# Patient Record
Sex: Male | Born: 1975 | Race: Black or African American | Hispanic: No | Marital: Single | Smoking: Former smoker
Health system: Southern US, Community
[De-identification: ages and names within clinical notes are randomized; demographics above are authoritative.]

## PROBLEM LIST (undated history)

## (undated) DIAGNOSIS — J302 Other seasonal allergic rhinitis: Secondary | ICD-10-CM

## (undated) DIAGNOSIS — E785 Hyperlipidemia, unspecified: Secondary | ICD-10-CM

## (undated) DIAGNOSIS — A549 Gonococcal infection, unspecified: Secondary | ICD-10-CM

## (undated) DIAGNOSIS — Z973 Presence of spectacles and contact lenses: Secondary | ICD-10-CM

## (undated) DIAGNOSIS — G8194 Hemiplegia, unspecified affecting left nondominant side: Secondary | ICD-10-CM

## (undated) DIAGNOSIS — G510 Bell's palsy: Secondary | ICD-10-CM

## (undated) HISTORY — PX: WISDOM TOOTH EXTRACTION: SHX21

## (undated) HISTORY — PX: APPENDECTOMY: SHX54

## (undated) HISTORY — DX: Other seasonal allergic rhinitis: J30.2

## (undated) HISTORY — DX: Bell's palsy: G51.0

## (undated) HISTORY — DX: Hyperlipidemia, unspecified: E78.5

## (undated) HISTORY — DX: Presence of spectacles and contact lenses: Z97.3

## (undated) HISTORY — DX: Gonococcal infection, unspecified: A54.9

## (undated) HISTORY — DX: Hemiplegia, unspecified affecting left nondominant side: G81.94

---

## 2011-05-04 ENCOUNTER — Emergency Department (INDEPENDENT_AMBULATORY_CARE_PROVIDER_SITE_OTHER)
Admission: EM | Admit: 2011-05-04 | Discharge: 2011-05-04 | Disposition: A | Discharge status: Home / Self Care | Payer: 59 | Source: Home / Self Care | Attending: Emergency Medicine | Admitting: Emergency Medicine

## 2011-05-04 DIAGNOSIS — S60944A Unspecified superficial injury of right ring finger, initial encounter: Secondary | ICD-10-CM

## 2011-05-04 DIAGNOSIS — S60949A Unspecified superficial injury of unspecified finger, initial encounter: Secondary | ICD-10-CM

## 2011-05-04 MED ORDER — TETANUS-DIPHTH-ACELL PERTUSSIS 5-2.5-18.5 LF-MCG/0.5 IM SUSP
0.5000 mL | Freq: Once | INTRAMUSCULAR | Status: AC
  Administered 2011-05-04: 0.5 mL via INTRAMUSCULAR

## 2011-05-04 MED ORDER — BACITRACIN 500 UNIT/GM EX OINT
1.0000 "application " | TOPICAL_OINTMENT | Freq: Once | CUTANEOUS | Status: AC
  Administered 2011-05-04: 1 via TOPICAL

## 2011-05-04 MED ORDER — TETANUS-DIPHTH-ACELL PERTUSSIS 5-2.5-18.5 LF-MCG/0.5 IM SUSP
INTRAMUSCULAR | Status: AC
  Filled 2011-05-04: qty 0.5

## 2011-05-04 NOTE — ED Notes (Signed)
Last tetanus was greater than 10 years per pt

## 2011-05-04 NOTE — ED Notes (Signed)
Avulsion injury to tip of rt 4th finger- no active bleeding.

## 2011-05-04 NOTE — ED Provider Notes (Signed)
History     CSN: [id number] Arrival date & time: 05/04/2011  8:39 AM   First MD Initiated Contact with Patient 05/04/11 0825      Chief Complaint  Patient presents with  . Finger Injury    (Consider location/radiation/quality/duration/timing/severity/associated sxs/prior treatment) HPI Comments: righthanded male sliced distal tip of right ring finger off yesterday with a mandolin at home. No N/V, parasthesias, redness, swelling, limitation of motion, weakness. Has not tried anything for this.  Last tetanus 1994.   Patient is a 35 y.o. male presenting with hand pain.  Hand Pain The current episode started yesterday. The problem has not changed since onset.The symptoms are aggravated by nothing. He has tried nothing for the symptoms.    History reviewed. No pertinent past medical history.  History reviewed. No pertinent past surgical history.  History reviewed. No pertinent family history.  History  Substance Use Topics  . Smoking status: Never Smoker   . Smokeless tobacco: Not on file  . Alcohol Use: Yes      Review of Systems  Constitutional: Negative for fever.  Gastrointestinal: Negative for nausea and vomiting.  Musculoskeletal: Negative for joint swelling.  Skin: Positive for wound.  Neurological: Negative for weakness and numbness.    Allergies  Review of patient's allergies indicates no known allergies.  Home Medications  No current outpatient prescriptions on file.  BP 133/62  Pulse 60  Temp(Src) 98.6 F (37 C) (Oral)  Resp 16  SpO2 100%  Physical Exam  Nursing note and vitals reviewed. Constitutional: He is oriented to person, place, and time. He appears well-developed and well-nourished.  HENT:  Head: Normocephalic and atraumatic.  Eyes: Conjunctivae and EOM are normal. Pupils are equal, round, and reactive to light.  Neck: Normal range of motion.  Cardiovascular: Regular rhythm.   Pulmonary/Chest: Effort normal. No respiratory distress.    Abdominal: He exhibits no distension.  Musculoskeletal: Normal range of motion.       Missing small amt soft tissue at distal ring finger. Clean cut.  No bleeding. No FB seen. Baseline Strength and Sensation to R ring finger hand with normal light touch intact for Pt, distal motor and sensation in median/radial/ulnar nerve distribution with CR< 2 secs and pulse intact.  Ring finger with intact motor strength 5/5 flexion / extension /FDP / FDS  against resistance and 2-point discrimination intact at 5mm.. Wrist WNLs   Neurological: He is alert and oriented to person, place, and time.  Skin: Skin is warm and dry.  Psychiatric: He has a normal mood and affect. His behavior is normal.    ED Course  Procedures (including critical care time)  Labs Reviewed - No data to display No results found.   1. Superficial injury of right ring finger       MDM  soaked wound in chlorhexadine, scrubbed with wter/chlorhexadine, bacitracin, sterile dressing. 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 \. Pt declined pain medication at this time.     Luiz Blare, MD 05/04/11 [phone]

## 2011-05-04 NOTE — ED Notes (Signed)
Pt states he was slicing food yesterday around 5 pm and cut his rt 4th finger tip.

## 2011-10-19 ENCOUNTER — Ambulatory Visit
Admission: RE | Admit: 2011-10-19 | Discharge: 2011-10-19 | Disposition: A | Discharge status: Home / Self Care | Payer: 59 | Source: Ambulatory Visit | Attending: Family Medicine | Admitting: Family Medicine

## 2011-10-19 ENCOUNTER — Other Ambulatory Visit: Payer: Self-pay | Admitting: Family Medicine

## 2011-10-19 DIAGNOSIS — M25519 Pain in unspecified shoulder: Secondary | ICD-10-CM

## 2011-10-19 DIAGNOSIS — Z0000 Encounter for general adult medical examination without abnormal findings: Secondary | ICD-10-CM

## 2012-08-27 ENCOUNTER — Encounter: Payer: Self-pay | Admitting: Medical

## 2012-08-27 ENCOUNTER — Ambulatory Visit (INDEPENDENT_AMBULATORY_CARE_PROVIDER_SITE_OTHER): Payer: 59 | Admitting: Medical

## 2012-08-27 VITALS — BP 138/80 | HR 68 | Temp 98.1°F | Resp 16 | Ht 69.0 in | Wt 215.0 lb

## 2012-08-27 DIAGNOSIS — E669 Obesity, unspecified: Secondary | ICD-10-CM

## 2012-08-27 DIAGNOSIS — E785 Hyperlipidemia, unspecified: Secondary | ICD-10-CM

## 2012-08-27 NOTE — Patient Instructions (Signed)
Cut down on high cholesterol foods, start exercising daily, recheck fasting in 3 months.  Hypercholesterolemia High Blood Cholesterol Cholesterol is a white, waxy, fat-like protein needed by your body in small amounts. The liver makes all the cholesterol you need. It is carried from the liver by the blood through the blood vessels. Deposits (plaque) may build up on blood vessel walls. This makes the arteries narrower and stiffer. Plaque increases the risk for heart attack and stroke. You cannot feel your cholesterol level even if it is very high. The only way to know is by a blood test to check your lipid (fats) levels. Once you know your cholesterol levels, you should keep a record of the test results. Work with your caregiver to to keep your levels in the desired range. WHAT THE RESULTS MEAN:  Total cholesterol is a rough measure of all the cholesterol in your blood.  LDL is the so-called bad cholesterol. This is the type that deposits cholesterol in the walls of the arteries. You want this level to be low.  HDL is the good cholesterol because it cleans the arteries and carries the LDL away. You want this level to be high.  Triglycerides are fat that the body can either burn for energy or store. High levels are closely linked to heart disease. DESIRED LEVELS:  Total cholesterol below 200.  LDL below 100 for people at risk, below 70 for very high risk.  HDL above 50 is good, above 60 is best.  Triglycerides below 150. HOW TO LOWER YOUR CHOLESTEROL:  Diet.  Choose fish or white meat chicken and [place], roasted or baked. Limit fatty cuts of red meat, fried foods, and processed meats, such as sausage and lunch meat.  Eat lots of fresh fruits and vegetables. Choose whole grains, beans, pasta, potatoes and cereals.  Use only small amounts of olive, corn or canola oils. Avoid butter, mayonnaise, shortening or palm kernel oils. Avoid foods with trans-fats.  Use skim/nonfat milk and  low-fat/nonfat yogurt and cheeses. Avoid whole milk, cream, ice cream, egg yolks and cheeses. Healthy desserts include angel food cake, gingersnaps, animal crackers, hard candy, popsicles, and low-fat/nonfat frozen yogurt. Avoid pastries, cakes, pies and cookies.  Exercise.  A regular program helps decrease LDL and raises HDL.  Helps with weight control.  Do things that increase your activity level like gardening, walking, or taking the stairs.  Medication.  May be prescribed by your caregiver to help lowering cholesterol and the risk for heart disease.  You may need medicine even if your levels are normal if you have several risk factors. HOME CARE INSTRUCTIONS   Follow your diet and exercise programs as suggested by your caregiver.  Take medications as directed.  Have blood work done when your caregiver feels it is necessary. MAKE SURE YOU:   Understand these instructions.  Will watch your condition.  Will get help right away if you are not doing well or get worse. Document Released: 05/08/2005 Document Revised: 07/31/2011 Document Reviewed: 10/24/2006 [facility] Patient Information 2013 [place], [place].

## 2012-08-27 NOTE — Progress Notes (Signed)
Subjective: Here as a new patient today.   Wants to establish care.   Has a hx/o high cholesterol x a few years, but didn't agree to starting medication this past year.   He thinks high cholesterol runs in his family.  Not much exercise at current.  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   He eats a lot of sweets, but no so much red meat or other high cholesterol foods.  Nonsmoker.  He last took Pravachol a few weeks ago.  Prior to a few weeks ago he would take medication a few days then forget a few days.    He is not sure of prior cholesterol numbers.  He is nonfasting.   No other c/o.    Objective: Filed Vitals:   08/27/12 1529  BP: 138/80  Pulse: 68  Temp: 98.1 F (36.7 C)  Resp: 16    General appearance: alert, no distress, WD/WN  Neck: supple, no lymphadenopathy, no thyromegaly, no masses, no bruits Heart: RRR, normal S1, S2, no murmurs Lungs: CTA bilaterally, no wheezes, rhonchi, or rales Pulses: 2+ symmetric, upper and lower extremities, normal cap refill  Assessment: Encounter Diagnoses  Name Primary?  . Hyperlipidemia Yes  . Obesity, unspecified     Plan: Make lifestyle changes, start exercising, avoid high cholesterol foods as we discussed, recheck fasting 47mo.   We will request prior records.  Follow-up 47mo

## 2012-11-13 ENCOUNTER — Encounter: Payer: Self-pay | Admitting: Internal Medicine

## 2012-11-26 ENCOUNTER — Ambulatory Visit: Payer: 59 | Admitting: Medical

## 2013-07-23 ENCOUNTER — Encounter: Payer: 59 | Admitting: Medical

## 2013-07-30 ENCOUNTER — Ambulatory Visit (INDEPENDENT_AMBULATORY_CARE_PROVIDER_SITE_OTHER): Payer: 59 | Admitting: Medical

## 2013-07-30 ENCOUNTER — Encounter: Payer: Self-pay | Admitting: Medical

## 2013-07-30 VITALS — BP 138/88 | HR 60 | Temp 98.0°F | Resp 14 | Ht 69.0 in | Wt 208.0 lb

## 2013-07-30 DIAGNOSIS — Z113 Encounter for screening for infections with a predominantly sexual mode of transmission: Secondary | ICD-10-CM

## 2013-07-30 DIAGNOSIS — H579 Unspecified disorder of eye and adnexa: Secondary | ICD-10-CM

## 2013-07-30 DIAGNOSIS — E785 Hyperlipidemia, unspecified: Secondary | ICD-10-CM

## 2013-07-30 DIAGNOSIS — Z0000 Encounter for general adult medical examination without abnormal findings: Secondary | ICD-10-CM

## 2013-07-30 DIAGNOSIS — R29810 Facial weakness: Secondary | ICD-10-CM

## 2013-07-30 DIAGNOSIS — E669 Obesity, unspecified: Secondary | ICD-10-CM

## 2013-07-30 LAB — COMPREHENSIVE METABOLIC PANEL
ALBUMIN: 4.8 g/dL (ref 3.5–5.2)
ALK PHOS: 68 U/L (ref 39–117)
ALT: 21 U/L (ref 0–53)
AST: 21 U/L (ref 0–37)
BUN: 11 mg/dL (ref 6–23)
CALCIUM: 9.8 mg/dL (ref 8.4–10.5)
CHLORIDE: 101 meq/L (ref 96–112)
CO2: 30 mEq/L (ref 19–32)
Creat: 0.93 mg/dL (ref 0.50–1.35)
Glucose, Bld: 88 mg/dL (ref 70–99)
POTASSIUM: 4.2 meq/L (ref 3.5–5.3)
SODIUM: 139 meq/L (ref 135–145)
TOTAL PROTEIN: 7.8 g/dL (ref 6.0–8.3)
Total Bilirubin: 1 mg/dL (ref 0.2–1.2)

## 2013-07-30 LAB — POCT URINALYSIS DIPSTICK
BILIRUBIN UA: NEGATIVE
Blood, UA: NEGATIVE
GLUCOSE UA: NEGATIVE
Ketones, UA: NEGATIVE
LEUKOCYTES UA: NEGATIVE
NITRITE UA: NEGATIVE
Protein, UA: NEGATIVE
Spec Grav, UA: <=1.005
UROBILINOGEN UA: NEGATIVE
pH, UA: 7

## 2013-07-30 LAB — CBC
HEMATOCRIT: 43.5 % (ref 39.0–52.0)
HEMOGLOBIN: 15.2 g/dL (ref 13.0–17.0)
MCH: 30.8 pg (ref 26.0–34.0)
MCHC: 34.9 g/dL (ref 30.0–36.0)
MCV: 88.1 fL (ref 78.0–100.0)
Platelets: 281 10*3/uL (ref 150–400)
RBC: 4.94 MIL/uL (ref 4.22–5.81)
RDW: 12.9 % (ref 11.5–15.5)
WBC: 8.2 10*3/uL (ref 4.0–10.5)

## 2013-07-30 LAB — LIPID PANEL
Cholesterol: 211 mg/dL — ABNORMAL HIGH (ref 0–200)
HDL: 38 mg/dL — AB (ref >39)
LDL CALC: 137 mg/dL — AB (ref 0–99)
Total CHOL/HDL Ratio: 5.6 Ratio
Triglycerides: 178 mg/dL — ABNORMAL HIGH (ref <150)
VLDL: 36 mg/dL (ref 0–40)

## 2013-07-30 NOTE — Patient Instructions (Addendum)
Thank you for giving me the opportunity to serve you today.    Your diagnosis today includes: Encounter Diagnoses  Name Primary?  . Routine general medical examination at a health care facility Yes  . Abnormal vision screen   . Hyperlipidemia   . Screen for STD (sexually transmitted disease)   . Facial weakness   . Obesity, unspecified      Specific recommendations today include:  See eye doctor due to abnormal vision exam  See dentist regularly  Exercises regularly, most days of the week  Eat a healthy diet  Obesity-work on losing weight through healthy diet and exercise  Facial weakness-we will call back about recommendations for this  Follow up: pending labs   I have included other useful information below for your review.  Hypertriglyceridemia  Diet for High blood levels of Triglycerides Most fats in food are triglycerides. Triglycerides in your blood are stored as fat in your body. High levels of triglycerides in your blood may put you at a greater risk for heart disease and stroke.  Normal triglyceride levels are less than 150 mg/dL. Borderline high levels are 150-199 mg/dl. High levels are 200 - 499 mg/dL, and very high triglyceride levels are greater than 500 mg/dL. The decision to treat high triglycerides is generally based on the level. For people with borderline or high triglyceride levels, treatment includes weight loss and exercise. Drugs are recommended for people with very high triglyceride levels. Many people who need treatment for high triglyceride levels have metabolic syndrome. This syndrome is a collection of disorders that often include: insulin resistance, high blood pressure, blood clotting problems, high cholesterol and triglycerides. TESTING PROCEDURE FOR TRIGLYCERIDES  You should not eat 4 hours before getting your triglycerides measured. The normal range of triglycerides is between 10 and 250 milligrams per deciliter (mg/dl). Some people may have  extreme levels (1000 or above), but your triglyceride level may be too high if it is above 150 mg/dl, depending on what other risk factors you have for heart disease.  People with high blood triglycerides may also have high blood cholesterol levels. If you have high blood cholesterol as well as high blood triglycerides, your risk for heart disease is probably greater than if you only had high triglycerides. High blood cholesterol is one of the main risk factors for heart disease. CHANGING YOUR DIET  Your weight can affect your blood triglyceride level. If you are more than 20% above your ideal body weight, you may be able to lower your blood triglycerides by losing weight. Eating less and exercising regularly is the best way to combat this. Fat provides more calories than any other food. The best way to lose weight is to eat less fat. Only 30% of your total calories should come from fat. Less than 7% of your diet should come from saturated fat. A diet low in fat and saturated fat is the same as a diet to decrease blood cholesterol. By eating a diet lower in fat, you may lose weight, lower your blood cholesterol, and lower your blood triglyceride level.  Eating a diet low in fat, especially saturated fat, may also help you lower your blood triglyceride level. Ask your dietitian to help you figure how much fat you can eat based on the number of calories your caregiver has prescribed for you.  Exercise, in addition to helping with weight loss may also help lower triglyceride levels.   Alcohol can increase blood triglycerides. You may need to stop drinking alcoholic  beverages.  Too much carbohydrate in your diet may also increase your blood triglycerides. Some complex carbohydrates are necessary in your diet. These may include bread, rice, potatoes, other starchy vegetables and cereals.  Reduce "simple" carbohydrates. These may include pure sugars, candy, honey, and jelly without losing other nutrients. If  you have the kind of high blood triglycerides that is affected by the amount of carbohydrates in your diet, you will need to eat less sugar and less high-sugar foods. Your caregiver can help you with this.  Adding 2-4 grams of fish oil (EPA+ DHA) may also help lower triglycerides. Speak with your caregiver before adding any supplements to your regimen. Following the Diet  Maintain your ideal weight. Your caregivers can help you with a diet. Generally, eating less food and getting more exercise will help you lose weight. Joining a weight control group may also help. Ask your caregivers for a good weight control group in your area.  Eat low-fat foods instead of high-fat foods. This can help you lose weight too.  These foods are lower in fat. Eat MORE of these:   Dried beans, peas, and lentils.  Egg whites.  Low-fat cottage cheese.  Fish.  Lean cuts of meat, such as round, sirloin, rump, and flank (cut extra fat off meat you fix).  Whole grain breads, cereals and pasta.  Skim and nonfat dry milk.  Low-fat yogurt.  Poultry without the skin.  Cheese made with skim or part-skim milk, such as mozzarella, parmesan, farmers', ricotta, or pot cheese. These are higher fat foods. Eat LESS of these:   Whole milk and foods made from whole milk, such as American, blue, cheddar, monterey jack, and swiss cheese  High-fat meats, such as luncheon meats, sausages, knockwurst, bratwurst, hot dogs, ribs, corned beef, ground pork, and regular ground beef.  Fried foods. Limit saturated fats in your diet. Substituting unsaturated fat for saturated fat may decrease your blood triglyceride level. You will need to read package labels to know which products contain saturated fats.  These foods are high in saturated fat. Eat LESS of these:   Fried pork skins.  Whole milk.  Skin and fat from poultry.  Palm oil.  Butter.  Shortening.  Cream cheese.  Tomasa Blase.  Margarines and baked goods made from  listed oils.  Vegetable shortenings.  Chitterlings.  Fat from meats.  Coconut oil.  Palm kernel oil.  Lard.  Cream.  Sour cream.  Fatback.  Coffee whiteners and non-dairy creamers made with these oils.  Cheese made from whole milk. Use unsaturated fats (both polyunsaturated and monounsaturated) moderately. Remember, even though unsaturated fats are better than saturated fats; you still want a diet low in total fat.  These foods are high in unsaturated fat:   Canola oil.  Sunflower oil.  Mayonnaise.  Almonds.  Peanuts.  Pine nuts.  Margarines made with these oils.  Safflower oil.  Olive oil.  Avocados.  Cashews.  Peanut butter.  Sunflower seeds.  Soybean oil.  Peanut oil.  Olives.  Pecans.  Walnuts.  Pumpkin seeds. Avoid sugar and other high-sugar foods. This will decrease carbohydrates without decreasing other nutrients. Sugar in your food goes rapidly to your blood. When there is excess sugar in your blood, your liver may use it to make more triglycerides. Sugar also contains calories without other important nutrients.  Eat LESS of these:   Sugar, brown sugar, powdered sugar, jam, jelly, preserves, honey, syrup, molasses, pies, candy, cakes, cookies, frosting, pastries, colas, soft drinks, punches,  fruit drinks, and regular gelatin.  Avoid alcohol. Alcohol, even more than sugar, may increase blood triglycerides. In addition, alcohol is high in calories and low in nutrients. Ask for sparkling water, or a diet soft drink instead of an alcoholic beverage. Suggestions for planning and preparing meals   Bake, broil, grill or roast meats instead of frying.  Remove fat from meats and skin from poultry before cooking.  Add spices, herbs, lemon juice or vinegar to vegetables instead of salt, rich sauces or gravies.  Use a non-stick skillet without fat or use no-stick sprays.  Cool and refrigerate stews and broth. Then remove the hardened fat  floating on the surface before serving.  Refrigerate meat drippings and skim off fat to make low-fat gravies.  Serve more fish.  Use less butter, margarine and other high-fat spreads on bread or vegetables.  Use skim or reconstituted non-fat dry milk for cooking.  Cook with low-fat cheeses.  Substitute low-fat yogurt or cottage cheese for all or part of the sour cream in recipes for sauces, dips or congealed salads.  Use half yogurt/half mayonnaise in salad recipes.  Substitute evaporated skim milk for cream. Evaporated skim milk or reconstituted non-fat dry milk can be whipped and substituted for whipped cream in certain recipes.  Choose fresh fruits for dessert instead of high-fat foods such as pies or cakes. Fruits are naturally low in fat. When Dining Out   Order low-fat appetizers such as fruit or vegetable juice, pasta with vegetables or tomato sauce.  Select clear, rather than cream soups.  Ask that dressings and gravies be served on the side. Then use less of them.  Order foods that are baked, broiled, poached, steamed, stir-fried, or roasted.  Ask for margarine instead of butter, and use only a small amount.  Drink sparkling water, unsweetened tea or coffee, or diet soft drinks instead of alcohol or other sweet beverages. QUESTIONS AND ANSWERS ABOUT OTHER FATS IN THE BLOOD: SATURATED FAT, TRANS FAT, AND CHOLESTEROL What is trans fat? Trans fat is a type of fat that is formed when vegetable oil is hardened through a process called hydrogenation. This process helps makes foods more solid, gives them shape, and prolongs their shelf life. Trans fats are also called hydrogenated or partially hydrogenated oils.  What do saturated fat, trans fat, and cholesterol in foods have to do with heart disease? Saturated fat, trans fat, and cholesterol in the diet all raise the level of LDL "bad" cholesterol in the blood. The higher the LDL cholesterol, the greater the risk for coronary  heart disease (CHD). Saturated fat and trans fat raise LDL similarly.  What foods contain saturated fat, trans fat, and cholesterol? High amounts of saturated fat are found in animal products, such as fatty cuts of meat, chicken skin, and full-fat dairy products like butter, whole milk, cream, and cheese, and in tropical vegetable oils such as palm, palm kernel, and coconut oil. Trans fat is found in some of the same foods as saturated fat, such as vegetable shortening, some margarines (especially hard or stick margarine), crackers, cookies, baked goods, fried foods, salad dressings, and other processed foods made with partially hydrogenated vegetable oils. Small amounts of trans fat also occur naturally in some animal products, such as milk products, beef, and lamb. Foods high in cholesterol include liver, other organ meats, egg yolks, shrimp, and full-fat dairy products. How can I use the new food label to make heart-healthy food choices? Check the Nutrition Facts panel of the food  label. Choose foods lower in saturated fat, trans fat, and cholesterol. For saturated fat and cholesterol, you can also use the Percent Daily Value (%DV): 5% DV or less is low, and 20% DV or more is high. (There is no %DV for trans fat.) Use the Nutrition Facts panel to choose foods low in saturated fat and cholesterol, and if the trans fat is not listed, read the ingredients and limit products that list shortening or hydrogenated or partially hydrogenated vegetable oil, which tend to be high in trans fat. POINTS TO REMEMBER:   Discuss your risk for heart disease with your caregivers, and take steps to reduce risk factors.  Change your diet. Choose foods that are low in saturated fat, trans fat, and cholesterol.  Add exercise to your daily routine if it is not already being done. Participate in physical activity of moderate intensity, like brisk walking, for at least 30 minutes on most, and preferably all days of the week. No  time? Break the 30 minutes into three, 10-minute segments during the day.  Stop smoking. If you do smoke, contact your caregiver to discuss ways in which they can help you quit.  Do not use street drugs.  Maintain a normal weight.  Maintain a healthy blood pressure.  Keep up with your blood work for checking the fats in your blood as directed by your caregiver. Document Released: 02/24/2004 Document Revised: 11/07/2011 Document Reviewed: 09/21/2008 [facility] Patient Information 2014 [place], [place].   Ophthalmology Dr. Glenford Peers [address] [place], [place] [address] [phone]   [facility] Dr. Gelene Mink [address] [place] [place], [place] [address]  [phone] [email or web address]   Vincenza Hews, M.D. Susanne Greenhouse, O.D. [address] [place], [place] [address] Medical telephone: [phone] Optical telephone: [phone]

## 2013-07-30 NOTE — Progress Notes (Signed)
Subjective:   HPI  Jeremiah Orozco is a 38 y.o. male who presents for a complete physical.  Medical care team includes:  Kristian Covey, PA-C here for primary care  Dentist - Dr. Shawnie Dapper   Preventative care: Last ophthalmology visit:n/a Last dental visit:yes Dr. Shawnie Dapper Last colonoscopy:n/a Last prostate exam: n/a Last RUE:AVWUJ Last labs:?  Prior vaccinations: TD or Tdap:04/2011 Influenza:n/a Pneumococcal:n/a Shingles/Zostavax:n/a Other: ?  Advanced directive:n/a Health care power of attorney:n/a Living will:n/a  Concerns: Wants STD screening.  Hx/o left facial weakness x few years ago.  Never saw neurology.  Saw urgent care, they thought it could be bells palsy vs stroke.   Reviewed their medical, surgical, family, social, medication, and allergy history and updated chart as appropriate.  Past Medical History  Diagnosis Date  . Hyperlipidemia   . Seasonal allergic rhinitis   . Wears contact lenses   . Gonorrhea     history of    Past Surgical History  Procedure Laterality Date  . Appendectomy      age 83yo  . Wisdom tooth extraction      History   Social History  . Marital Status: Single    Spouse Name: N/A    Number of Children: N/A  . Years of Education: N/A   Occupational History  . Not on file.   Social History Main Topics  . Smoking status: Never Smoker   . Smokeless tobacco: Not on file  . Alcohol Use: 3.6 oz/week    6 Cans of beer per week  . Drug Use: No  . Sexual Activity: Not on file   Other Topics Concern  . Not on file   Social History Narrative   Single, no children, fork Sales promotion account executive, Christian, exercise - some weights, walking    Family History  Problem Relation Age of Onset  . Diabetes Mother   . Hypertension Mother   . Hyperlipidemia Mother   . Stroke Mother   . Cancer Maternal Aunt     breast  . Heart disease Paternal Grandmother   . Stroke Paternal Grandmother     No current outpatient prescriptions on file.  No  Known Allergies   Review of Systems Constitutional: -fever, -chills, -sweats, -unexpected weight change, -decreased appetite, -fatigue Allergy: -sneezing, -itching, -congestion Dermatology: -changing moles, --rash, -lumps ENT: -runny nose, -ear pain, -sore throat, -hoarseness, -sinus pain, -teeth pain, - ringing in ears, -hearing loss, -nosebleeds Cardiology: -chest pain, -palpitations, -swelling, -difficulty breathing when lying flat, -waking up short of breath Respiratory: -cough, -shortness of breath, -difficulty breathing with exercise or exertion, -wheezing, -coughing up blood Gastroenterology: -abdominal pain, -nausea, -vomiting, -diarrhea, -constipation, -blood in stool, -changes in bowel movement, -difficulty swallowing or eating Hematology: -bleeding, -bruising  Musculoskeletal: -joint aches, -muscle aches, -joint swelling, -back pain, -neck pain, -cramping, -changes in gait Ophthalmology: denies vision changes, eye redness, itching, discharge Urology: -burning with urination, -difficulty urinating, -blood in urine, -urinary frequency, -urgency, -incontinence Neurology: -headache, -weakness, -tingling, -numbness, -memory loss, -falls, -dizziness Psychology: -depressed mood, -agitation, -sleep problems     Objective:   Physical Exam  BP 138/88  Pulse 60  Temp(Src) 98 F (36.7 C) (Oral)  Resp 14  Ht 5\' 9"  (1.753 m)  Wt 208 lb (94.348 kg)  BMI 30.70 kg/m2  General appearance: alert, no distress, WD/WN, obese AA male Skin: few scattered benign appearing macules, no worrisome lesions HEENT: normocephalic, conjunctiva/corneas normal, sclerae anicteric, PERRLA, EOMi, nares patent, no discharge or erythema, pharynx normal Oral cavity: MMM, tongue normal, teeth in  good repair Neck: supple, no lymphadenopathy, no thyromegaly, no masses, normal ROM, no bruit Chest: non tender, normal shape and expansion Heart: RRR, normal S1, S2, no murmurs Lungs: CTA bilaterally, no wheezes,  rhonchi, or rales Abdomen: +bs, soft, right lower quadrant surgical scar, non tender, non distended, no masses, no hepatomegaly, no splenomegaly, no bruits Back: non tender, normal ROM, no scoliosis Musculoskeletal: upper extremities non tender, no obvious deformity, normal ROM throughout, lower extremities non tender, no obvious deformity, normal ROM throughout Extremities: no edema, no cyanosis, no clubbing Pulses: 2+ symmetric, upper and lower extremities, normal cap refill Neurological: Left forehead, cheek and lips asymmetrically weak  Throughout, compared to the right side, he is able to keep the left eye close with resistance, but cannot raise the left for head cannot smile on the left with the same facial muscles on the right, otherwise alert, oriented x 3, CN2-12 intact, strength normal upper extremities and lower extremities, sensation normal throughout, DTRs 2+ throughout, no cerebellar signs, gait normal Psychiatric: normal affect, behavior normal, pleasant  GU: normal male external genitalia,circumcised, nontender, no masses, no hernia, no lymphadenopathy Rectal: deferred   Assessment and Plan :      Encounter Diagnoses  Name Primary?  . Routine general medical examination at a health care facility Yes  . Abnormal vision screen   . Hyperlipidemia   . Screen for STD (sexually transmitted disease)   . Facial weakness   . Obesity, unspecified     Physical exam - discussed healthy lifestyle, diet, exercise, preventative care, vaccinations, and addressed their concerns.   Routine labs today, advise he see eye doctor for abnormal vision exam Screen for STD today Facial weakness-we will call him back with recommendations Obesity work on lifestyle changes and weight loss Follow-up pending labs

## 2013-07-31 LAB — TSH: TSH: 1.95 u[IU]/mL (ref 0.350–4.500)

## 2013-07-31 LAB — HIV ANTIBODY (ROUTINE TESTING W REFLEX): HIV: NONREACTIVE

## 2013-07-31 LAB — GC/CHLAMYDIA PROBE AMP
CT PROBE, AMP APTIMA: NEGATIVE
GC PROBE AMP APTIMA: NEGATIVE

## 2013-07-31 LAB — RPR

## 2013-08-05 ENCOUNTER — Telehealth: Payer: Self-pay | Admitting: Family Medicine

## 2013-08-05 NOTE — Telephone Encounter (Signed)
PATIENT IS AWARE OF HIS APPOINTMENT AT [facility] ON 08/13/13 @ 1130 AM [address] [place], [place] [phone]

## 2013-08-12 ENCOUNTER — Encounter: Payer: Self-pay | Admitting: Neurology

## 2013-08-13 ENCOUNTER — Encounter: Payer: Self-pay | Admitting: Neurology

## 2013-08-13 ENCOUNTER — Ambulatory Visit (INDEPENDENT_AMBULATORY_CARE_PROVIDER_SITE_OTHER): Payer: 59 | Admitting: Neurology

## 2013-08-13 VITALS — BP 127/68 | HR 56 | Ht 69.0 in | Wt 212.0 lb

## 2013-08-13 DIAGNOSIS — G510 Bell's palsy: Secondary | ICD-10-CM

## 2013-08-13 HISTORY — DX: Bell's palsy: G51.0

## 2013-08-13 NOTE — Patient Instructions (Signed)
Bell's Palsy  Bell's palsy is a condition in which the muscles on one side of the face cannot move (paralysis). This is because the nerves in the face are paralyzed. It is most often thought to be caused by a virus. The virus causes swelling of the nerve that controls movement on one side of the face. The nerve travels through a tight space surrounded by bone. When the nerve swells, it can be compressed by the bone. This results in damage to the protective covering around the nerve. This damage interferes with how the nerve communicates with the muscles of the face. As a result, it can cause weakness or paralysis of the facial muscles.   Injury (trauma), tumor, and surgery may cause Bell's palsy, but most of the time the cause is unknown. It is a relatively common condition. It starts suddenly (abrupt onset) with the paralysis usually ending within 2 days. Bell's palsy is not dangerous. But because the eye does not close properly, you may need care to keep the eye from getting dry. This can include splinting (to keep the eye shut) or moistening with artificial tears. Bell's palsy very seldom occurs on both sides of the face at the same time.  SYMPTOMS    Eyebrow sagging.   Drooping of the eyelid and corner of the mouth.   Inability to close one eye.   Loss of taste on the front of the tongue.   Sensitivity to loud noises.  TREATMENT   The treatment is usually non-surgical. If the patient is seen within the first 24 to 48 hours, a short course of steroids may be prescribed, in an attempt to shorten the length of the condition. Antiviral medicines may also be used with the steroids, but it is unclear if they are helpful.   You will need to protect your eye, if you cannot close it. The cornea (clear covering over your eye) will become dry and can be damaged. Artificial tears can be used to keep your eye moist. Glasses or an eye patch should be worn to protect your eye.  PROGNOSIS   Recovery is variable, ranging  from days to months. Although the problem usually goes away completely (about 80% of cases resolve), predicting the outcome is impossible. Most people improve within 3 weeks of when the symptoms began. Improvement may continue for 3 to 6 months. A small number of people have moderate to severe weakness that is permanent.   HOME CARE INSTRUCTIONS    If your caregiver prescribed medication to reduce swelling in the nerve, use as directed. Do not stop taking the medication unless directed by your caregiver.   Use moisturizing eye drops as needed to prevent drying of your eye, as directed by your caregiver.   Protect your eye, as directed by your caregiver.   Use facial massage and exercises, as directed by your caregiver.   Perform your normal activities, and get your normal rest.  SEEK IMMEDIATE MEDICAL CARE IF:    There is pain, redness or irritation in the eye.   You or your child has an oral temperature above 102 F (38.9 C), not controlled by medicine.  MAKE SURE YOU:    Understand these instructions.   Will watch your condition.   Will get help right away if you are not doing well or get worse.  Document Released: 05/08/2005 Document Revised: 07/31/2011 Document Reviewed: 05/17/2009  Exit[employer] Patient Information 2014 [employer].

## 2013-08-13 NOTE — Progress Notes (Signed)
Reason for visit: Bell's palsy  Jeremiah Orozco is a 38 y.o. male  History of present illness:  Jeremiah Orozco is a 38 year old right-handed black male with a history of a left facial weakness that began about one year ago. The patient indicates that the weakness came on gradually, unassociated with pain. The patient never sought medical attention at the time of the onset of the weakness. The patient had some difficulty with speech with labial sounds, and he noted that liquids would come out of the side of the mouth on the left. The patient denied any alteration in taste or hearing. The patient did not have any other type of weakness on the arms or legs, and he denied any balance issues or problems with numbness of the extremities. The patient did improve, but he is left with some residual facial weakness on the left. The patient has noted that when he eats, he may tear on the left eye. The patient comes to this office or an evaluation.  Past Medical History  Diagnosis Date  . Hyperlipidemia   . Seasonal allergic rhinitis   . Wears contact lenses   . Gonorrhea     history of  . Bell's palsy 08/13/2013    left    Past Surgical History  Procedure Laterality Date  . Appendectomy      age 14yo  . Wisdom tooth extraction      Family History  Problem Relation Age of Onset  . Diabetes Mother   . Hypertension Mother   . Hyperlipidemia Mother   . Stroke Mother   . Cancer Maternal Aunt     breast  . Heart disease Paternal Grandmother   . Stroke Paternal Grandmother     Social history:  reports that he has been smoking Cigarettes.  He has been smoking about 0.20 packs per day. He has never used smokeless tobacco. He reports that he drinks about 3.6 ounces of alcohol per week. He reports that he does not use illicit drugs.  Medications:  No current outpatient prescriptions on file prior to visit.   No current facility-administered medications on file prior to visit.     No Known  Allergies  ROS:  Out of a complete 14 system review of symptoms, the patient complains only of the following symptoms, and all other reviewed systems are negative.  Left facial weakness  Blood pressure 127/68, pulse 56, height 5\' 9"  (1.753 m), weight 212 lb (96.163 kg).  Physical Exam  General: The patient is alert and cooperative at the time of the examination. The patient is minimally obese.  Eyes: Pupils are equal, round, and reactive to light. Discs are flat bilaterally.  Neck: The neck is supple, no carotid bruits are noted.  Respiratory: The respiratory examination is clear.  Cardiovascular: The cardiovascular examination reveals a regular rate and rhythm, no obvious murmurs or rubs are noted.  Skin: Extremities are without significant edema.  Neurologic Exam  Mental status: The patient is alert and oriented x 3 at the time of the examination. The patient has apparent normal recent and remote memory, with an apparently normal attention span and concentration ability.  Cranial nerves: Facial symmetry is not present. The patient has slight facial asymmetry, incomplete elevation of the left eyebrow. With attempted elevation of the left eyebrow, the corner of the mouth on the left elevates. With smiling, the left eye closes slightly. There is good sensation of the face to pinprick and soft touch bilaterally. The strength of  the facial muscles and the muscles to head turning and shoulder shrug are normal bilaterally. Speech is well enunciated, no aphasia or dysarthria is noted. Extraocular movements are full. Visual fields are full. The tongue is midline, and the patient has symmetric elevation of the soft palate. No obvious hearing deficits are noted.  Motor: The motor testing reveals 5 over 5 strength of all 4 extremities. Good symmetric motor tone is noted throughout.  Sensory: Sensory testing is intact to pinprick, soft touch, vibration sensation, and position sense on all 4  extremities. No evidence of extinction is noted.  Coordination: Cerebellar testing reveals good finger-nose-finger and heel-to-shin bilaterally.  Gait and station: Gait is normal. Tandem gait is normal. Romberg is negative. No drift is seen.  Reflexes: Deep tendon reflexes are symmetric and normal bilaterally. Toes are downgoing bilaterally.   Assessment/Plan:  1. 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 At this point, no specific treatment can be offered. The patient will be left with permanent left facial weakness at this point. The patient will followup through this office if needed. No further neurologic evaluation is indicated at this time.  Marlan Palau MD 08/13/2013 7:33 PM  [facility] [address] [place], [place] [address]  Phone [phone] Fax [phone]

## 2014-01-01 ENCOUNTER — Emergency Department (HOSPITAL_COMMUNITY): Payer: 59

## 2014-01-01 ENCOUNTER — Emergency Department (HOSPITAL_COMMUNITY)
Admission: EM | Admit: 2014-01-01 | Discharge: 2014-01-01 | Disposition: A | Discharge status: Home / Self Care | Payer: 59 | Attending: Emergency Medicine | Admitting: Emergency Medicine

## 2014-01-01 ENCOUNTER — Encounter (HOSPITAL_COMMUNITY): Payer: Self-pay | Admitting: Emergency Medicine

## 2014-01-01 DIAGNOSIS — Y9389 Activity, other specified: Secondary | ICD-10-CM | POA: Insufficient documentation

## 2014-01-01 DIAGNOSIS — Y92410 Unspecified street and highway as the place of occurrence of the external cause: Secondary | ICD-10-CM | POA: Insufficient documentation

## 2014-01-01 DIAGNOSIS — F17200 Nicotine dependence, unspecified, uncomplicated: Secondary | ICD-10-CM | POA: Insufficient documentation

## 2014-01-01 DIAGNOSIS — M549 Dorsalgia, unspecified: Secondary | ICD-10-CM

## 2014-01-01 DIAGNOSIS — IMO0002 Reserved for concepts with insufficient information to code with codable children: Secondary | ICD-10-CM | POA: Insufficient documentation

## 2014-01-01 DIAGNOSIS — S0993XA Unspecified injury of face, initial encounter: Secondary | ICD-10-CM | POA: Insufficient documentation

## 2014-01-01 DIAGNOSIS — Z8669 Personal history of other diseases of the nervous system and sense organs: Secondary | ICD-10-CM | POA: Insufficient documentation

## 2014-01-01 DIAGNOSIS — Z8619 Personal history of other infectious and parasitic diseases: Secondary | ICD-10-CM | POA: Diagnosis not present

## 2014-01-01 DIAGNOSIS — R0789 Other chest pain: Secondary | ICD-10-CM

## 2014-01-01 DIAGNOSIS — Z8639 Personal history of other endocrine, nutritional and metabolic disease: Secondary | ICD-10-CM | POA: Insufficient documentation

## 2014-01-01 DIAGNOSIS — S199XXA Unspecified injury of neck, initial encounter: Secondary | ICD-10-CM

## 2014-01-01 DIAGNOSIS — S298XXA Other specified injuries of thorax, initial encounter: Secondary | ICD-10-CM | POA: Diagnosis not present

## 2014-01-01 DIAGNOSIS — Z862 Personal history of diseases of the blood and blood-forming organs and certain disorders involving the immune mechanism: Secondary | ICD-10-CM | POA: Diagnosis not present

## 2014-01-01 MED ORDER — METHOCARBAMOL 500 MG PO TABS: 500.0000 mg | ORAL_TABLET | Freq: Two times a day (BID) | ORAL | Status: DC

## 2014-01-01 MED ORDER — OXYCODONE-ACETAMINOPHEN 5-325 MG PO TABS: 1.0000 | ORAL_TABLET | ORAL | Status: DC | PRN
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
## 2014-01-01 NOTE — Discharge Instructions (Signed)
Take the prescribed medication as directed. °Follow-up with your primary care physician. °Return to the ED for new or worsening symptoms. ° °

## 2014-01-02 NOTE — ED Provider Notes (Signed)
Medical screening examination/treatment/procedure(s) were performed by non-physician practitioner and as supervising physician I was immediately available for consultation/collaboration.  Ayaana Biondo T Stefhanie Kachmar, MD 01/02/14 1525 

## 2014-01-12 ENCOUNTER — Encounter: Payer: Self-pay | Admitting: Medical

## 2014-01-12 ENCOUNTER — Ambulatory Visit (INDEPENDENT_AMBULATORY_CARE_PROVIDER_SITE_OTHER): Payer: 59 | Admitting: Medical

## 2014-01-12 VITALS — BP 130/80 | HR 58 | Temp 97.9°F | Resp 16 | Wt 207.0 lb

## 2014-01-12 DIAGNOSIS — S39012A Strain of muscle, fascia and tendon of lower back, initial encounter: Secondary | ICD-10-CM

## 2014-01-12 DIAGNOSIS — S161XXA Strain of muscle, fascia and tendon at neck level, initial encounter: Secondary | ICD-10-CM

## 2014-01-12 DIAGNOSIS — IMO0002 Reserved for concepts with insufficient information to code with codable children: Secondary | ICD-10-CM

## 2014-01-12 DIAGNOSIS — S139XXA Sprain of joints and ligaments of unspecified parts of neck, initial encounter: Secondary | ICD-10-CM

## 2014-01-12 DIAGNOSIS — Z23 Encounter for immunization: Secondary | ICD-10-CM

## 2014-01-12 MED ORDER — NAPROXEN 500 MG PO TABS: 500.0000 mg | ORAL_TABLET | Freq: Two times a day (BID) | ORAL | Status: DC
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
## 2014-06-01 ENCOUNTER — Encounter: Payer: Self-pay | Admitting: Medical

## 2014-06-01 ENCOUNTER — Ambulatory Visit (INDEPENDENT_AMBULATORY_CARE_PROVIDER_SITE_OTHER): Payer: 59 | Admitting: Medical

## 2014-06-01 VITALS — BP 130/80 | HR 60 | Temp 97.8°F | Resp 16 | Wt 210.0 lb

## 2014-06-01 DIAGNOSIS — R519 Headache, unspecified: Secondary | ICD-10-CM

## 2014-06-01 DIAGNOSIS — R110 Nausea: Secondary | ICD-10-CM

## 2014-06-01 DIAGNOSIS — R51 Headache: Secondary | ICD-10-CM

## 2014-06-01 DIAGNOSIS — H8110 Benign paroxysmal vertigo, unspecified ear: Secondary | ICD-10-CM

## 2014-06-01 MED ORDER — MECLIZINE HCL 25 MG PO TABS: ORAL_TABLET | ORAL | Status: DC

## 2014-06-01 MED ORDER — ONDANSETRON HCL 4 MG PO TABS: 4.0000 mg | ORAL_TABLET | Freq: Three times a day (TID) | ORAL | Status: DC | PRN

## 2014-06-01 NOTE — Progress Notes (Signed)
Subjective This past week having vertigo.  Saturday morning awoke with room spinning .  Any time lying down feels worse with vertigo.  Having these symptom intermittent all weekend.   Lasts for 1-2 minutes when it occurs throughout the day.  Stumbled a little getting out the bed this morning.   Using nothing for symptom.  Water intake is ok currently.  No recent URI illness.   No recent head injury.   Doesn't recall having this a lot in the past, maybe once before in remote past.  Has had some soreness in back or right neck, some headaches just recently.  No recent injury or trauma. 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  Denies paraesthesias, no tinnitus, no hearing loss.  No other aggravating or relieving factors. No other complaint.  Review of Systems Constitutional: -fever, -chills, -sweats, -unexpected weight change, -fatigue ENT: -runny nose, -ear pain, -sore throat Cardiology:  -chest pain, -palpitations, -edema Respiratory: -cough, -shortness of breath, -wheezing Gastroenterology: -abdominal pain, -nausea, -vomiting, -diarrhea, -constipation Hematology: -bleeding or bruising problems Musculoskeletal: -arthralgias, -myalgias, -joint swelling, -back pain Ophthalmology: -vision changes Urology: -dysuria, -difficulty urinating, -hematuria, -urinary frequency, -urgency  Objective: BP 130/80 mmHg  Pulse 60  Temp(Src) 97.8 F (36.6 C) (Oral)  Resp 16  Wt 210 lb (95.255 kg)  General appearance: alert, no distress, WD/WN HEENT: normocephalic, sclerae anicteric, PERRLA, EOMi, nares patent, no discharge or erythema, pharynx normal Oral cavity: MMM, no lesions Neck: supple, no lymphadenopathy, no thyromegaly, no masses, no bruits Heart: RRR, normal S1, S2, no murmurs Lungs: CTA bilaterally, no wheezes, rhonchi, or rales Back: non tender Musculoskeletal: nontender, no swelling, no obvious deformity Extremities: no edema, no cyanosis, no clubbing Pulses: 2+ symmetric, upper and  lower extremities, normal cap refill Neurological: alert, oriented x 3, CN2-12 intact, strength normal upper extremities and lower extremities, sensation normal throughout, DTRs 2+ throughout, no cerebellar signs, gait normal Psychiatric: normal affect, behavior normal, pleasant    Assessment: Encounter Diagnoses  Name Primary?  . Benign paroxysmal positional vertigo, unspecified laterality Yes  . Headache, unspecified headache type   . Nausea without vomiting     Plan: BPPV - discussed diagnosis, symptoms, treatment.  Begin meclizine TID x 1wk, then prn.   Call/return if worse or not improving zofran for nausea prn Headache - discussed neck and shoulder stretching, tension headaches, OTC medications. F/u prn.

## 2014-07-20 ENCOUNTER — Encounter: Payer: Self-pay | Admitting: Medical

## 2014-07-20 ENCOUNTER — Ambulatory Visit (INDEPENDENT_AMBULATORY_CARE_PROVIDER_SITE_OTHER): Payer: 59 | Admitting: Medical

## 2014-07-20 VITALS — BP 118/76 | HR 76 | Resp 16 | Ht 69.25 in | Wt 208.0 lb

## 2014-07-20 DIAGNOSIS — E785 Hyperlipidemia, unspecified: Secondary | ICD-10-CM

## 2014-07-20 DIAGNOSIS — Z0000 Encounter for general adult medical examination without abnormal findings: Secondary | ICD-10-CM | POA: Diagnosis not present

## 2014-07-20 DIAGNOSIS — E669 Obesity, unspecified: Secondary | ICD-10-CM | POA: Diagnosis not present

## 2014-07-20 DIAGNOSIS — G510 Bell's palsy: Secondary | ICD-10-CM

## 2014-07-20 LAB — POCT URINALYSIS DIPSTICK
BILIRUBIN UA: NEGATIVE
Blood, UA: NEGATIVE
GLUCOSE UA: NEGATIVE
KETONES UA: NEGATIVE
LEUKOCYTES UA: NEGATIVE
Nitrite, UA: NEGATIVE
PH UA: 6
PROTEIN UA: NEGATIVE
SPEC GRAV UA: 1.025
Urobilinogen, UA: NEGATIVE

## 2014-07-20 LAB — LIPID PANEL
CHOLESTEROL: 208 mg/dL — AB (ref 0–200)
HDL: 38 mg/dL — AB (ref >=40)
LDL Cholesterol: 127 mg/dL — ABNORMAL HIGH (ref 0–99)
TRIGLYCERIDES: 215 mg/dL — AB (ref <150)
Total CHOL/HDL Ratio: 5.5 Ratio
VLDL: 43 mg/dL — ABNORMAL HIGH (ref 0–40)

## 2014-07-20 LAB — HEMOGLOBIN A1C
Hgb A1c MFr Bld: 5.2 % (ref <5.7)
Mean Plasma Glucose: 103 mg/dL (ref <117)

## 2014-07-20 NOTE — Progress Notes (Signed)
Subjective:   HPI  Jeremiah Orozco is a 39 y.o. male who presents for a complete physical.   Preventative care: Last ophthalmology visit: last summer Last dental visit: December Last colonoscopy: never Last prostate exam: never Last EKG:? Last labs:?  Prior vaccinations: TD or Tdap: 2012 Influenza: august 2015  Concerns: None in particular  Reviewed their medical, surgical, family, social, medication, and allergy history and updated chart as appropriate.  Past Medical History  Diagnosis Date  . Hyperlipidemia   . Seasonal allergic rhinitis   . Wears contact lenses   . Gonorrhea     history of  . Bell's palsy 08/13/2013    left    Past Surgical History  Procedure Laterality Date  . Appendectomy      age 1yo  . Wisdom tooth extraction      History   Social History  . Marital Status: Single    Spouse Name: N/A  . Number of Children: 0  . Years of Education: hs   Occupational History  .     Social History Main Topics  . Smoking status: Former Smoker -- 0.20 packs/day    Types: Cigarettes  . Smokeless tobacco: Never Used  . Alcohol Use: 3.6 oz/week    6 Cans of beer per week     Comment: weekly  . Drug Use: No  . Sexual Activity: Not on file   Other Topics Concern  . Not on file   Social History Narrative   Single, no children, [employer] account executive, Christian, exercise - some weights, walking. In a relationship with Evern Core x few years, monogamous, interested in pursuing real estate sale/investment as of 06/2014.    Family History  Problem Relation Age of Onset  . Diabetes Mother   . Hypertension Mother   . Hyperlipidemia Mother   . Stroke Mother   . Cancer Maternal Aunt     breast  . Heart disease Paternal Grandmother   . Stroke Paternal Grandmother   . Hypertension Paternal Grandmother   . Diabetes Paternal Grandmother     No current outpatient prescriptions on file.  No Known Allergies    Review of Systems Constitutional: -fever,  -chills, -sweats, -unexpected weight change, -decreased appetite, -fatigue Allergy: -sneezing, -itching, -congestion Dermatology: -changing moles, --rash, -lumps ENT: -runny nose, -ear pain, -sore throat, -hoarseness, -sinus pain, -teeth pain, - ringing in ears, -hearing loss, -nosebleeds Cardiology: -chest pain, -palpitations, -swelling, -difficulty breathing when lying flat, -waking up short of breath Respiratory: -cough, -shortness of breath, -difficulty breathing with exercise or exertion, -wheezing, -coughing up blood Gastroenterology: -abdominal pain, -nausea, -vomiting, -diarrhea, -constipation, -blood in stool, -changes in bowel movement, -difficulty swallowing or eating Hematology: -bleeding, -bruising  Musculoskeletal: -joint aches, -muscle aches, -joint swelling, -back pain, -neck pain, -cramping, -changes in gait Ophthalmology: denies vision changes, eye redness, itching, discharge Urology: -burning with urination, -difficulty urinating, -blood in urine, -urinary frequency, -urgency, -incontinence Neurology: -headache, -weakness, -tingling, -numbness, -memory loss, -falls, -dizziness Psychology: -depressed mood, -agitation, -sleep problems     Objective:   Physical Exam  BP 118/76 mmHg  Pulse 76  Resp 16  Ht 5' 9.25" (1.759 m)  Wt 208 lb (94.348 kg)  BMI 30.49 kg/m2  General appearance: alert, no distress, WD/WN, obese AA male Skin: few scattered benign appearing macules, no worrisome lesions, band of darker brown coloration around the neck suggestive of acanthosis nigricans HEENT: normocephalic, conjunctiva/corneas normal, sclerae anicteric, PERRLA, EOMi, nares patent, no discharge or erythema, pharynx normal Oral cavity: MMM, tongue  normal, teeth in good repair Neck: supple, no lymphadenopathy, no thyromegaly, no masses, normal ROM, no bruit Chest: non tender, normal shape and expansion Heart: RRR, normal S1, S2, no murmurs Lungs: CTA bilaterally, no wheezes, rhonchi, or  rales Abdomen: +bs, soft, right lower quadrant surgical scar, non tender, non distended, no masses, no hepatomegaly, no splenomegaly, no bruits Back: non tender, normal ROM, no scoliosis Musculoskeletal: upper extremities non tender, no obvious deformity, normal ROM throughout, lower extremities non tender, no obvious deformity, normal ROM throughout Extremities: no edema, no cyanosis, no clubbing Pulses: 2+ symmetric, upper and lower extremities, normal cap refill Neurological: Left forehead, cheek and lips asymmetrically weak Throughout, compared to the right side, he is able to keep the left eye close with resistance, but cannot raise the left for head cannot smile on the left with the same facial muscles on the right, otherwise alert, oriented x 3, CN2-12 intact, strength normal upper extremities and lower extremities, sensation normal throughout, DTRs 2+ throughout, no cerebellar signs, gait normal Psychiatric: normal affect, behavior normal, pleasant  GU: normal male external genitalia,circumcised, nontender, no masses, no hernia, no lymphadenopathy Rectal: deferred   Assessment and Plan :    Encounter Diagnoses  Name Primary?  . Encounter for health maintenance examination in adult Yes  . Obesity   . Hyperlipidemia   . Bell's palsy     Physical exam - discussed healthy lifestyle, diet, exercise, preventative care, vaccinations, and addressed their concerns.   I reviewed his labs from last year which are all fine except for cholesterol. Declines STD testing today.   Hemoglobin A1c and lipid panel today We discussed progressing on healthy diet, low-cholesterol diet, exercise and weight loss efforts Bell's palsy-reviewed neurology notes from last year, he has residual left facial hemiparalysis Follow-up pending labs
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
## 2015-07-05 ENCOUNTER — Encounter: Payer: 59 | Admitting: Medical
